# Patient Record
Sex: Female | Born: 1995 | Race: White | Hispanic: No | Marital: Single | Smoking: Never smoker
Health system: Southern US, Community
[De-identification: ages and names within clinical notes are randomized; demographics above are authoritative.]

---
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
## 2015-05-20 ENCOUNTER — Ambulatory Visit (INDEPENDENT_AMBULATORY_CARE_PROVIDER_SITE_OTHER): Payer: Medicaid Other | Admitting: Family Medicine

## 2015-05-20 ENCOUNTER — Encounter: Payer: Self-pay | Admitting: Family Medicine

## 2015-05-20 VITALS — BP 117/55 | HR 80 | Temp 98.6°F | Ht 64.0 in | Wt 200.0 lb

## 2015-05-20 DIAGNOSIS — J029 Acute pharyngitis, unspecified: Secondary | ICD-10-CM

## 2015-05-20 LAB — POCT RAPID STREP A (OFFICE): RAPID STREP A SCREEN: NEGATIVE

## 2015-05-20 MED ORDER — CETIRIZINE HCL 10 MG PO TABS: 10.0000 mg | ORAL_TABLET | Freq: Every day | ORAL | Status: DC

## 2015-05-20 NOTE — Progress Notes (Signed)
    Subjective   Zoe Orr is a 19 y.o. female that presents for a same day visit  SORE THROAT  Sore throat last week. Pain is: burning, tightness, soreness  Severity: 8/10 Medications tried: no Strep throat exposure: yes  STD exposure: no  Symptoms Fever: no Cough: yes Runny nose: yes Muscle aches: yes  Swollen Glands: yes Trouble breathing: yes Drooling: no Weight loss: no  PMH - Smoking status noted.    History  Substance Use Topics  . Smoking status: Never Smoker   . Smokeless tobacco: Not on file  . Alcohol Use: No    ROS Per HPI  Objective   BP 117/55 mmHg  Pulse 80  Temp(Src) 98.6 F (37 C)  Ht 5\' 4"  (1.626 m)  Wt 200 lb (90.719 kg)  BMI 34.31 kg/m2  LMP 04/19/2015 (Approximate)  General: NAD, alert, cooperative with exam, HEENT: NCAT, PERRL, clear conjunctiva, oropharynx clear, supple neck, TM's clear and intact, no tonsillar exudates, boggy turbinates Respiratory/Chest: CTABL, no wheezes, non-labored   Assessment and Plan   Please refer to problem based charting of assessment and plan

## 2015-05-20 NOTE — Patient Instructions (Signed)
Thank you for coming in,   I have sent in the zyrtec. If this persist then please come back and be evaluated again.   Please bring all of your medications with you to each visit.    Please feel free to call with any questions or concerns at any time, at [phone]. --Dr. Jordan Likes

## 2015-05-22 ENCOUNTER — Encounter: Payer: Self-pay | Admitting: Family Medicine

## 2015-05-22 DIAGNOSIS — J029 Acute pharyngitis, unspecified: Secondary | ICD-10-CM | POA: Insufficient documentation

## 2015-05-22 NOTE — Progress Notes (Signed)
One of the assigned preceptors. 

## 2015-05-22 NOTE — Assessment & Plan Note (Signed)
Rapid strep neg. Most likely post nasal drip from URI.  - refilled zyrtec

## 2015-07-22 ENCOUNTER — Ambulatory Visit (INDEPENDENT_AMBULATORY_CARE_PROVIDER_SITE_OTHER): Payer: Medicaid Other | Admitting: Family Medicine

## 2015-07-22 ENCOUNTER — Encounter: Payer: Self-pay | Admitting: Family Medicine

## 2015-07-22 VITALS — BP 120/57 | HR 84 | Temp 98.2°F | Ht 64.0 in | Wt 210.3 lb

## 2015-07-22 DIAGNOSIS — M79671 Pain in right foot: Secondary | ICD-10-CM | POA: Diagnosis not present

## 2015-07-22 DIAGNOSIS — E669 Obesity, unspecified: Secondary | ICD-10-CM

## 2015-07-22 NOTE — Patient Instructions (Signed)
Quit drinking sodas and juice. Your weight is getting out of control. Start slow on the dancing.  Too much too fast will cause injuries. Do build up to much exercise, just slowly.  Staying active is a big key to keeping your weight down.

## 2015-07-22 NOTE — Assessment & Plan Note (Signed)
Clearly musculoskeletal.  Will not x ray now even though stress fracture is possible.  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

## 2015-07-22 NOTE — Progress Notes (Signed)
   Subjective:    Patient ID: Zoe Orr, female    DOB: Sep 02, 1996, 19 y.o.   MRN: [id number]  HPI Not a well child check.  Check right foot pain.  She experience right foot pain right after walking several miles in flip flops.  This occurred 3 days ago and it is still quite sore. She has concerns about how it will affect her work (stands as a Conservation officer, nature at [employer]) and dance classes which are set to resume next week.  Obesity is the other issue we dealt with.  She has gained 40 lbs in the last year.  Admits to drinking lots of sodas.  She says she is active..  Mother states she has way too much screen time - mostly computer and internet.    Review of Systems     Objective:   Physical Exam Right foot.  Pain is at the base of the fifth metatarsal at the area of the tarsal/metatarsal joint.  No swelling, redness or suspicion of foreign body.       Assessment & Plan:

## 2015-07-22 NOTE — Assessment & Plan Note (Signed)
Worrisome especially considering her family history of severe obesity.  Also contributing to foot problems.

## 2015-08-26 ENCOUNTER — Emergency Department (INDEPENDENT_AMBULATORY_CARE_PROVIDER_SITE_OTHER)
Admission: EM | Admit: 2015-08-26 | Discharge: 2015-08-26 | Disposition: A | Discharge status: Home / Self Care | Payer: Medicaid Other | Source: Home / Self Care

## 2015-08-26 ENCOUNTER — Encounter (HOSPITAL_COMMUNITY): Payer: Self-pay | Admitting: Emergency Medicine

## 2015-08-26 DIAGNOSIS — S29012A Strain of muscle and tendon of back wall of thorax, initial encounter: Secondary | ICD-10-CM

## 2015-08-26 MED ORDER — IBUPROFEN 600 MG PO TABS: 600.0000 mg | ORAL_TABLET | Freq: Four times a day (QID) | ORAL | Status: DC | PRN

## 2015-08-26 MED ORDER — METHOCARBAMOL 500 MG PO TABS: 500.0000 mg | ORAL_TABLET | Freq: Four times a day (QID) | ORAL | Status: DC | PRN

## 2015-08-26 NOTE — Discharge Instructions (Signed)
Likely strained some of the muscles of the upper back. Fortunately there is no evidence of permanent injury. This will likely improve over the next several days. Patient never to stay active but avoid any activities that make your symptoms significantly worse. He may feel some pain with activity and that is okay. Please take the medications as prescribed and apply heat or ice to the affected area to see if it helps. Please follow-up with your primary care physician Dr. Leveda Anna.

## 2015-08-26 NOTE — ED Notes (Signed)
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 Steady gait... No acute distress.

## 2015-08-26 NOTE — ED Provider Notes (Signed)
CSN: [id number]     Arrival date & time 08/26/15  1638 History   None    Chief Complaint  Patient presents with  . Back Pain   (Consider location/radiation/quality/duration/timing/severity/associated sxs/prior Treatment) HPI  Upper back pain. Nonradiating. Between the shoulder blades. Gradual onset over the course of the work day and at dance in the evening. Was a fairly constant but with waxing and waning nature. Patient is not taken anything for her symptoms. Denies any chest pain, shortness of breath, palpitations, fevers, rash. Denies any previous trauma to this area. Patient states that she lifts approximately 6-12 packs of drinks at a time at work and then the several lifts and other strenuous activity while at dance.  History reviewed. No pertinent past medical history. History reviewed. No pertinent past surgical history. No family history on file. Social History  Substance Use Topics  . Smoking status: Never Smoker   . Smokeless tobacco: None  . Alcohol Use: No   OB History    No data available     Review of Systems Per HPI with all other pertinent systems negative.   Allergies  Amoxicillin and Ampicillin  Home Medications   Prior to Admission medications   Medication Sig Start Date End Date Taking? Authorizing Provider  amantadine (SYMMETREL) 100 MG capsule Take 200 mg by mouth 2 (two) times daily.     Historical Provider, MD  buPROPion (WELLBUTRIN XL) 300 MG 24 hr tablet Take 300 mg by mouth daily.    Historical Provider, MD  cetirizine (ZYRTEC) 10 MG tablet Take 1 tablet (10 mg total) by mouth daily. 05/20/15   Myra Rude, MD  doxycycline (VIBRA-TABS) 100 MG tablet Take 1 tablet (100 mg total) by mouth 2 (two) times daily. 09/13/14   Moses Manners, MD  ibuprofen (ADVIL,MOTRIN) 600 MG tablet Take 1 tablet (600 mg total) by mouth every 6 (six) hours as needed. 08/26/15   Ozella Rocks, MD  lamoTRIgine (LAMICTAL) 100 MG tablet Take 100 mg by mouth daily.      Historical Provider, MD  methocarbamol (ROBAXIN) 500 MG tablet Take 1 tablet (500 mg total) by mouth every 6 (six) hours as needed for muscle spasms. 08/26/15   Ozella Rocks, MD   Meds Ordered and Administered this Visit  Medications - No data to display  BP 121/73 mmHg  Pulse 64  Temp(Src) 98.6 F (37 C) (Oral)  Resp 16  SpO2 100% No data found.   Physical Exam Physical Exam  Constitutional: oriented to person, place, and time. appears well-developed and well-nourished. No distress.  HENT:  Head: Normocephalic and atraumatic.  Eyes: EOMI. PERRL.  Neck: Normal range of motion.  Cardiovascular: RRR, no m/r/g, 2+ distal pulses,  Pulmonary/Chest: Effort normal and breath sounds normal. No respiratory distress.  Abdominal: Soft. Bowel sounds are normal. NonTTP, no distension.  Musculoskeletal: Back full range of motion, no scapular tenderness, mild mid to upper thoracic paraspinal muscle tenderness to palpation, no focal tenderness or abnormality, no bony upper [place], spine straight. Neurological: alert and oriented to person, place, and time.  Skin: Skin is warm. No rash noted. non diaphoretic.  Psychiatric: normal mood and affect. behavior is normal. Judgment and thought content normal.   ED Course  Procedures (including critical care time)  Labs Review Labs Reviewed - No data to display  Imaging Review No results found.   Visual Acuity Review  Right Eye Distance:   Left Eye Distance:   Bilateral Distance:  Right Eye Near:   Left Eye Near:    Bilateral Near:         MDM   1. Upper back strain, initial encounter    Ibuprofen 600 mg and Robaxin. Patient encouraged to stay active and apply heat or ice as needed.    Ozella Rocks, MD 08/26/15 [phone]

## 2015-09-14 ENCOUNTER — Ambulatory Visit (INDEPENDENT_AMBULATORY_CARE_PROVIDER_SITE_OTHER): Payer: Medicaid Other | Admitting: *Deleted

## 2015-09-14 DIAGNOSIS — Z23 Encounter for immunization: Secondary | ICD-10-CM | POA: Diagnosis present

## 2015-11-15 ENCOUNTER — Encounter: Payer: Self-pay | Admitting: Family Medicine

## 2015-11-15 ENCOUNTER — Ambulatory Visit (INDEPENDENT_AMBULATORY_CARE_PROVIDER_SITE_OTHER): Payer: Medicaid Other | Admitting: Family Medicine

## 2015-11-15 ENCOUNTER — Other Ambulatory Visit (HOSPITAL_COMMUNITY)
Admission: RE | Admit: 2015-11-15 | Discharge: 2015-11-15 | Disposition: A | Discharge status: Home / Self Care | Payer: Medicaid Other | Source: Ambulatory Visit | Attending: Family Medicine | Admitting: Family Medicine

## 2015-11-15 VITALS — BP 122/68 | HR 84 | Temp 98.2°F | Ht 64.0 in | Wt 216.0 lb

## 2015-11-15 DIAGNOSIS — Z202 Contact with and (suspected) exposure to infections with a predominantly sexual mode of transmission: Secondary | ICD-10-CM | POA: Diagnosis present

## 2015-11-15 DIAGNOSIS — R300 Dysuria: Secondary | ICD-10-CM | POA: Diagnosis not present

## 2015-11-15 DIAGNOSIS — Z113 Encounter for screening for infections with a predominantly sexual mode of transmission: Secondary | ICD-10-CM | POA: Insufficient documentation

## 2015-11-15 LAB — POCT URINALYSIS DIPSTICK
Bilirubin, UA: NEGATIVE
Blood, UA: NEGATIVE
GLUCOSE UA: NEGATIVE
Ketones, UA: NEGATIVE
LEUKOCYTES UA: NEGATIVE
NITRITE UA: NEGATIVE
PROTEIN UA: NEGATIVE
Spec Grav, UA: 1.02
Urobilinogen, UA: 0.2
pH, UA: 7.5

## 2015-11-15 LAB — POCT WET PREP (WET MOUNT): CLUE CELLS WET PREP WHIFF POC: NEGATIVE
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
## 2015-11-15 NOTE — Patient Instructions (Signed)
Thank you for coming in today!  We are checking some labs today, and we'll call you with the results and leave a voicemail stating all tests are negative if they are negative. If they are positive we will continue to try to get ahold of you.   Make a follow up appointment with your primary care provider to discuss getting back on your medications and for routine health screenings.   Our clinic's number is [phone]. Feel free to call any time with questions or concerns. We will answer any questions after hours with our 24-hour emergency line at that number as well.   - Dr. Jarvis NewcomerGrunz

## 2015-11-16 LAB — CERVICOVAGINAL ANCILLARY ONLY
Chlamydia: NEGATIVE
Neisseria Gonorrhea: NEGATIVE

## 2015-11-16 LAB — HIV ANTIBODY (ROUTINE TESTING W REFLEX): HIV: NONREACTIVE

## 2015-11-16 LAB — RPR

## 2016-03-02 ENCOUNTER — Other Ambulatory Visit: Payer: Medicaid Other

## 2016-03-02 DIAGNOSIS — Z1159 Encounter for screening for other viral diseases: Secondary | ICD-10-CM

## 2016-03-05 LAB — RUBELLA SCREEN: Rubella: 1.07 Index — ABNORMAL HIGH (ref <0.90)

## 2016-03-06 ENCOUNTER — Encounter: Payer: Self-pay | Admitting: Family Medicine

## 2016-03-08 ENCOUNTER — Telehealth: Payer: Self-pay | Admitting: Family Medicine

## 2016-03-08 NOTE — Telephone Encounter (Signed)
Pt is calling because her teacher would like to get a MMR, because her teacher said that this would be good for her because of the titer readings. Please put the order in for the shot so that she can come and get this. She is needing this by Monday 03/12/16. Zoe Orr

## 2016-03-09 NOTE — Telephone Encounter (Signed)
Agree, no additional immunization needed.

## 2016-03-09 NOTE — Telephone Encounter (Signed)
Spoke with patient and informed her that her titer showed immunity and she has had both MMR required for any program or school.  Advised her I would speak with her PCP to see if there is any need for her to have a 3rd vaccine and that I would call her if this was necessary.  Patient is aware that she is ok to start clinicals based on her titer and shot record.  Zoe Orr,CMA

## 2016-08-07 ENCOUNTER — Encounter (HOSPITAL_COMMUNITY): Payer: Self-pay | Admitting: Emergency Medicine

## 2016-08-07 ENCOUNTER — Emergency Department (HOSPITAL_COMMUNITY)
Admission: EM | Admit: 2016-08-07 | Discharge: 2016-08-07 | Disposition: A | Discharge status: Home / Self Care | Payer: Medicaid Other | Attending: Emergency Medicine | Admitting: Emergency Medicine

## 2016-08-07 DIAGNOSIS — S46812A Strain of other muscles, fascia and tendons at shoulder and upper arm level, left arm, initial encounter: Secondary | ICD-10-CM | POA: Insufficient documentation

## 2016-08-07 DIAGNOSIS — Y9389 Activity, other specified: Secondary | ICD-10-CM | POA: Insufficient documentation

## 2016-08-07 DIAGNOSIS — Y999 Unspecified external cause status: Secondary | ICD-10-CM | POA: Diagnosis not present

## 2016-08-07 DIAGNOSIS — S4992XA Unspecified injury of left shoulder and upper arm, initial encounter: Secondary | ICD-10-CM | POA: Diagnosis present

## 2016-08-07 DIAGNOSIS — Y92410 Unspecified street and highway as the place of occurrence of the external cause: Secondary | ICD-10-CM | POA: Insufficient documentation

## 2016-08-07 DIAGNOSIS — Z79899 Other long term (current) drug therapy: Secondary | ICD-10-CM | POA: Diagnosis not present

## 2016-08-07 DIAGNOSIS — S46819A Strain of other muscles, fascia and tendons at shoulder and upper arm level, unspecified arm, initial encounter: Secondary | ICD-10-CM

## 2016-08-07 MED ORDER — HYDROCODONE-ACETAMINOPHEN 5-325 MG PO TABS: 1.0000 | ORAL_TABLET | ORAL | 0 refills | Status: DC | PRN

## 2016-08-07 MED ORDER — IBUPROFEN 600 MG PO TABS: 600.0000 mg | ORAL_TABLET | Freq: Four times a day (QID) | ORAL | 0 refills | Status: AC

## 2016-08-07 MED ORDER — IBUPROFEN 800 MG PO TABS
800.0000 mg | ORAL_TABLET | Freq: Once | ORAL | Status: AC
  Administered 2016-08-07: 800 mg via ORAL
  Filled 2016-08-07: qty 1

## 2016-08-07 MED ORDER — DIAZEPAM 5 MG PO TABS
5.0000 mg | ORAL_TABLET | Freq: Once | ORAL | Status: AC
  Administered 2016-08-07: 5 mg via ORAL
  Filled 2016-08-07: qty 1

## 2016-08-07 MED ORDER — METHOCARBAMOL 500 MG PO TABS: 500.0000 mg | ORAL_TABLET | Freq: Three times a day (TID) | ORAL | 0 refills | Status: DC

## 2016-08-07 MED ORDER — HYDROCODONE-ACETAMINOPHEN 5-325 MG PO TABS
2.0000 | ORAL_TABLET | Freq: Once | ORAL | Status: AC
  Administered 2016-08-07: 2 via ORAL
  Filled 2016-08-07: qty 2
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
## 2016-08-07 NOTE — Discharge Instructions (Signed)
Your examination suggests trapezius strain on the left greater than the right. Please use 600 mg of ibuprofen with breakfast, lunch, dinner, and at bedtime. Use Robaxin 3 times daily for spasm pain. May use Norco for more severe pain. Robaxin and Norco may cause drowsiness, please use these medications with caution. Do not drive or drink alcohol, or operate machinery when taking these medications. Please see your primary physician, or return to the emergency department if any changes, problems, or concerns.

## 2016-11-29 ENCOUNTER — Ambulatory Visit (INDEPENDENT_AMBULATORY_CARE_PROVIDER_SITE_OTHER): Payer: Medicaid Other | Admitting: Internal Medicine

## 2016-11-29 ENCOUNTER — Encounter: Payer: Self-pay | Admitting: Internal Medicine

## 2016-11-29 VITALS — BP 108/72 | HR 89 | Temp 98.3°F | Wt 228.0 lb

## 2016-11-29 DIAGNOSIS — Z308 Encounter for other contraceptive management: Secondary | ICD-10-CM | POA: Diagnosis not present

## 2016-11-29 DIAGNOSIS — Z23 Encounter for immunization: Secondary | ICD-10-CM | POA: Diagnosis not present

## 2016-11-29 NOTE — Progress Notes (Signed)
[facility] Progress Note  Subjective:  Zoe Orr is a 20 y.o. female who presents for nexplanon removal. This was placed October 15th, 2015, but patient thought it was due to be removed. When informed it lasts 3 years, she still requested removal, as she "never liked it in [her] arm." She also wants to resume normal menstrual cycles. At first, she barely had a period, but she has been having some dark blood in her underwear over the past few months. Initially, she needed to wear a pad, but now has been having spotting only occasionally. She states that she is not sexually active and had the nexplanon inserted to help with painful but not very heavy periods. She still has been having some cramping monthly but has not been taking anything for it. She states that another reason she would like nexplanon removed is because she "might as well get used to" having a period again. She is also concerned that her mood has been worse since having nexplanon inserted. She had similar concerns with depo shots. She is not currently taking any medications for bipolar disorder.  ROS: No increased vaginal discharge, no weakness  Chief Complaint  Patient presents with  . Contraception    Allergies  Allergen Reactions  . Amoxicillin     REACTION: unspecified  . Ampicillin     Objective: Blood pressure 108/72, pulse 89, temperature 98.3 F (36.8 C), temperature source Oral, weight 228 lb (103.4 kg). Body mass index is 39.14 kg/m. Constitutional: Obese female, in NAD Skin: Nexplanon palpable in left upper arm. Small scar where previously inserted.   Psychiatric: Somewhat odd affect.  Vitals reviewed  Assessment/Plan: Despite counseling that breakthrough bleeding is common with Nexplanon and recommendation to try ibuprofen for cramping, patient wished to proceed with Nexplanon removal. This was performed as detailed below. She did not want to start another form of birth control at this  time, as she is not sexually active and wants to know what her "normal cycles" would be like.   Nexplanon Removal Patient was given informed consent for removal of her Nexplanon.  Appropriate time out taken. Nexplanon site identified.  Area prepped in usual sterile fashon. One ml of 1% lidocaine was used to anesthetize the area at the distal end of the implant. A small stab incision was made right beside the implant on the distal portion.  The Nexplanon rod was grasped using hemostats and removed without difficulty.  There was minimal blood loss. There were no complications.  A small amount of antibiotic ointment and steri-strips were applied over the small incision.  A pressure bandage was applied to reduce any bruising.  The patient tolerated the procedure well and was given post procedure instructions.  Patient is planning to use abstinence for contraception.  Follow-up as needed.  Dani GobbleHillary Ariq Khamis, MD [facility], PGY-2

## 2016-11-29 NOTE — Patient Instructions (Signed)
Ms. Zoe Orr,  I removed your nexplanon today. Some bruising at the site is normal. Keep the pressure bandage in place until tomorrow. Keep the strips on for 5 days. If they come off, cover with a band-aid.  If you have trouble with your periods, please come back to see us. I recommend ibuprofen for cramps.  Best, Dr. Sampson GoonFitzgerald

## 2016-11-30 ENCOUNTER — Telehealth: Payer: Self-pay | Admitting: *Deleted

## 2016-11-30 NOTE — Telephone Encounter (Signed)
Please advise patient to take ibuprofen and Tylenol. Unless she is doing a lot of heavy lifting at work, no restrictions medically necessary. Thank you.  Dani GobbleHillary Fitzgerald, MD PGY-2

## 2016-11-30 NOTE — Telephone Encounter (Signed)
Letter placed up front for pick up. Jazmin Hartsell,CMA

## 2016-11-30 NOTE — Telephone Encounter (Signed)
Pt came in office wanting a note because she does heavy lifting at work and she wants a note out of work. Please advise. Deseree Bruna PotterBlount, CMA

## 2016-11-30 NOTE — Telephone Encounter (Signed)
Patient calling again, would like and excuse for today and tomorrow.

## 2016-11-30 NOTE — Telephone Encounter (Signed)
Patient requesting a note to excuse her from work today. States her arm is still really sore from where she had nexplanon removed yesterday. Will forward to MD seen yesterday.

## 2016-11-30 NOTE — Telephone Encounter (Signed)
My, if I did not have the background knowledge that I do with this family, I would want to see Porcia to insure no important surgical complication.  However, knowing how easily overwhelmed Varnell and mom, I doubt this represents an important medical problem.  I will provide the letter as requested.

## 2016-11-30 NOTE — Telephone Encounter (Signed)
Mother called and states that patient is experiencing arm pain, "extreme swelling" and can't lift or move it.  She would like a note for work excusing her for the weekend.  Informed mom that we have already tried reaching out to provider who performed the nexplanon removal and that we will need to wait and hear back from her.  Mother states that she will be here between 11-12:30pm to pick this up.  I advised mom that I can't guarantee that this will be ready by then or that anyone else will write this on behalf of the Dr. Sampson GoonFitzgerald.  Mother was upset and stated that she "will go to the top of [place]", if her daughter loses her job over this letter.  I was unable to offer patient an appointment to be seen today due to mother getting off the phone.  Will forward to MD and also PCP. Veda Arrellano,CMA

## 2017-03-07 ENCOUNTER — Ambulatory Visit (INDEPENDENT_AMBULATORY_CARE_PROVIDER_SITE_OTHER): Payer: Medicaid Other | Admitting: Family Medicine

## 2017-03-07 ENCOUNTER — Encounter: Payer: Self-pay | Admitting: Family Medicine

## 2017-03-07 DIAGNOSIS — R300 Dysuria: Secondary | ICD-10-CM

## 2017-03-07 DIAGNOSIS — N912 Amenorrhea, unspecified: Secondary | ICD-10-CM

## 2017-03-07 DIAGNOSIS — E669 Obesity, unspecified: Secondary | ICD-10-CM | POA: Diagnosis not present

## 2017-03-07 DIAGNOSIS — E282 Polycystic ovarian syndrome: Secondary | ICD-10-CM | POA: Insufficient documentation

## 2017-03-07 LAB — POCT URINE PREGNANCY: PREG TEST UR: NEGATIVE

## 2017-03-07 LAB — POCT URINALYSIS DIP (MANUAL ENTRY)
Bilirubin, UA: NEGATIVE
Blood, UA: NEGATIVE
Glucose, UA: NEGATIVE
Ketones, POC UA: NEGATIVE
LEUKOCYTES UA: NEGATIVE
NITRITE UA: NEGATIVE
PROTEIN UA: NEGATIVE
UROBILINOGEN UA: 0.2 (ref ?–2.0)
pH, UA: 6 (ref 5.0–8.0)

## 2017-03-07 MED ORDER — MEDROXYPROGESTERONE ACETATE 10 MG PO TABS: 10.0000 mg | ORAL_TABLET | Freq: Every day | ORAL | 0 refills | Status: DC

## 2017-03-07 NOTE — Patient Instructions (Addendum)
I doubt there is a problem causing your lack of periods.  It just takes time. See me in June/July if still not having regular periods. I will call with lab test results You do not have a bladder infection.

## 2017-03-07 NOTE — Assessment & Plan Note (Signed)
Likely related to nexplanon.  Could have underlying PCOS.  For now, cycle with progesterone and wait.  Check TSH.  More work up in 3 months if still amenorrhea.

## 2017-03-07 NOTE — Progress Notes (Signed)
   Subjective:    Patient ID: Zoe Orr, female    DOB: 10/15/1996, 21 y.o.   MRN: [id number]  HPI Amenorrhea since nexplanon removed 11/29/16.  Was on menses when nexplanon removed.  Denies sexual activity.  States no possibility of pregnancy.  States periods were regular prior to nexplanon insertion in Oct 2015.  Denies pain, vag DC fever.  No spotting.    Also C/O 2 days of mild dysuria.  Wonders if she has UTI.    Review of Systems     Objective:   Physical Exam Obese.  Not hirsuit. Abd benign U preg neg UA, not worrisome for infection.        Assessment & Plan:

## 2017-03-07 NOTE — Assessment & Plan Note (Signed)
Doubt PCOS at this point.

## 2017-03-07 NOTE — Assessment & Plan Note (Signed)
No UTI by UA

## 2017-03-08 LAB — TSH: TSH: 1.05 u[IU]/mL (ref 0.450–4.500)

## 2017-03-08 LAB — CBC
HEMATOCRIT: 44.1 % (ref 34.0–46.6)
HEMOGLOBIN: 15 g/dL (ref 11.1–15.9)
MCH: 30.3 pg (ref 26.6–33.0)
MCHC: 34 g/dL (ref 31.5–35.7)
MCV: 89 fL (ref 79–97)
Platelets: 285 10*3/uL (ref 150–379)
RBC: 4.95 x10E6/uL (ref 3.77–5.28)
RDW: 14.6 % (ref 12.3–15.4)
WBC: 6.9 10*3/uL (ref 3.4–10.8)

## 2017-06-27 ENCOUNTER — Ambulatory Visit (INDEPENDENT_AMBULATORY_CARE_PROVIDER_SITE_OTHER): Payer: Medicaid Other | Admitting: Family Medicine

## 2017-06-27 ENCOUNTER — Encounter: Payer: Self-pay | Admitting: Family Medicine

## 2017-06-27 DIAGNOSIS — E669 Obesity, unspecified: Secondary | ICD-10-CM

## 2017-06-27 DIAGNOSIS — N912 Amenorrhea, unspecified: Secondary | ICD-10-CM

## 2017-06-27 MED ORDER — MEDROXYPROGESTERONE ACETATE 10 MG PO TABS: 10.0000 mg | ORAL_TABLET | Freq: Every day | ORAL | 0 refills | Status: DC

## 2017-06-27 NOTE — Assessment & Plan Note (Signed)
Likely anovulatory given previous response to provera.  Will do amenorrhea/PCOS labs.  Cycle again with prover.

## 2017-06-27 NOTE — Patient Instructions (Signed)
I sent in another prescription for the medicine to bring on your period.  You need to have a period every three months. I will call with the blood test results. The key for you is weight loss.

## 2017-06-27 NOTE — Progress Notes (Signed)
   Subjective:    Patient ID: Zoe Orr, female    DOB: 10/02/1996, 21 y.o.   MRN: [id number]  HPI patient with ammenorrhea since removal of nexplanon 11/2016  It had been insert on 09/2014.  Menarache age 21.  Was regular with menses until nexplanon.  Coincidentally, she gained 50 lbs from 2015 to 2018.  Did have withdrawal menses with provera.  She denies abd pain.  Not sexually active.  No chance of prenancy.  She is hirsuit.  Given age and wt, PCOS is also a consideration.    Review of Systems     Objective:   Physical Exam Abd benign.       Assessment & Plan:

## 2017-06-27 NOTE — Assessment & Plan Note (Signed)
Contributing to medical problems.  Strongly encouraged wt loss and exercise.

## 2017-06-28 LAB — PROLACTIN: PROLACTIN: 23 ng/mL (ref 4.8–23.3)

## 2017-06-28 LAB — FSH/LH
FSH: 4.6 m[IU]/mL
LH: 28.4 m[IU]/mL

## 2017-06-28 LAB — TESTOSTERONE: TESTOSTERONE: 59 ng/dL — AB (ref 8–48)

## 2017-07-01 ENCOUNTER — Telehealth: Payer: Self-pay | Admitting: Family Medicine

## 2017-07-01 NOTE — Telephone Encounter (Signed)
Called and spoke to patient.  Told blood test suggests PCOS.  Has appointment to see me in two weeks.  Will explain more and likely start OCPs at that time.

## 2017-07-01 NOTE — Telephone Encounter (Signed)
Mom would like to be called regarding pt's lab results. ep

## 2017-07-17 ENCOUNTER — Ambulatory Visit (INDEPENDENT_AMBULATORY_CARE_PROVIDER_SITE_OTHER): Payer: Medicaid Other | Admitting: Family Medicine

## 2017-07-17 ENCOUNTER — Encounter: Payer: Self-pay | Admitting: Family Medicine

## 2017-07-17 VITALS — BP 110/72 | HR 71 | Temp 98.3°F | Ht 64.0 in | Wt 243.4 lb

## 2017-07-17 DIAGNOSIS — E282 Polycystic ovarian syndrome: Secondary | ICD-10-CM | POA: Diagnosis not present

## 2017-07-17 DIAGNOSIS — Z01419 Encounter for gynecological examination (general) (routine) without abnormal findings: Secondary | ICD-10-CM

## 2017-07-17 DIAGNOSIS — Z124 Encounter for screening for malignant neoplasm of cervix: Secondary | ICD-10-CM | POA: Diagnosis not present

## 2017-07-17 NOTE — Patient Instructions (Signed)
You have polycystic ovary disease.  Your body does not produce the exact correct female hormones.  It is best treated with

## 2017-07-18 ENCOUNTER — Telehealth: Payer: Self-pay | Admitting: Family Medicine

## 2017-07-18 ENCOUNTER — Other Ambulatory Visit (HOSPITAL_COMMUNITY)
Admission: RE | Admit: 2017-07-18 | Discharge: 2017-07-18 | Disposition: A | Discharge status: Home / Self Care | Payer: Medicaid Other | Source: Ambulatory Visit | Attending: Family Medicine | Admitting: Family Medicine

## 2017-07-18 DIAGNOSIS — Z124 Encounter for screening for malignant neoplasm of cervix: Secondary | ICD-10-CM | POA: Diagnosis not present

## 2017-07-18 DIAGNOSIS — Z01419 Encounter for gynecological examination (general) (routine) without abnormal findings: Secondary | ICD-10-CM | POA: Insufficient documentation

## 2017-07-18 MED ORDER — NORETHIN-ETH ESTRAD TRIPHASIC 0.5/0.75/1-35 MG-MCG PO TABS: 1.0000 | ORAL_TABLET | Freq: Every day | ORAL | 11 refills | Status: DC

## 2017-07-18 NOTE — Assessment & Plan Note (Signed)
Start BCP in this non smoker.

## 2017-07-18 NOTE — Telephone Encounter (Signed)
Done

## 2017-07-18 NOTE — Telephone Encounter (Signed)
Mother is calling to remind the doctor not to forget and call in her daughter Zoe Orr pills. She knows that [employer] was down and we didn't have access to escribe medications. Please call in today. jw

## 2017-07-18 NOTE — Progress Notes (Signed)
   Subjective:    Patient ID: Zoe SilverAriel M Orr, female    DOB: 05/04/1996, 21 y.o.   MRN: [id number]  HPI FU ammenorrhea.  Did have withdrawal period after provera.  Labs show elevated testosterone.  Given complex of ammenorrhea, obesity and hirsutism, warrants dx of polycystic ovary.  Non smoker.  Discussed Rx with BCPs  21st birthday today, which makes her due for pap      Review of Systems     Objective:   Physical ExamAbd benign Pelvic scant old blood in vaginal vault.  Cervix normal Pap taken.  Bimanual normal but limited due to obesity.          Assessment & Plan:

## 2017-07-18 NOTE — Assessment & Plan Note (Signed)
First pap/pelvic done today.

## 2017-07-22 LAB — CYTOLOGY - PAP
CHLAMYDIA, DNA PROBE: NEGATIVE
Diagnosis: NEGATIVE
NEISSERIA GONORRHEA: NEGATIVE

## 2017-09-02 ENCOUNTER — Ambulatory Visit: Payer: Self-pay | Admitting: Student

## 2017-09-16 ENCOUNTER — Telehealth: Payer: Self-pay | Admitting: *Deleted

## 2017-09-16 NOTE — Telephone Encounter (Signed)
Patient's mom called stating patient should have started her menstrual  cycle last Monday.  She only had small amount of dark discharge, not enough to wear a pad.  Patient is taking birth control pills as prescribed. Advised mom to have patient schedule an appointment to discuss birth control and menstrual cycle. Clovis Pu, RN

## 2017-09-16 NOTE — Progress Notes (Signed)
   Subjective:   Patient ID: Zoe Orr    DOB: Aug 17, 1996, 21 y.o. female   MRN: [id number]  Zoe Orr is a 20 y.o. female with a history of PCOS, bipolar disorder here for   Menstrual irregularities - LMP 08/09/2017 - currently on combo OCPs with good compliance, has alarm set on phone - not sexually active - Negative Pap Smear 07/2017 - Testosterone level 59 06/2017 - Wants more regular cycles - spotted about a week before she was supposed to have a period but then did not have a period when she was supposed to this month. Last month bled for two weeks. - Currently is in the 1st week of her 3rd month on OCPs - States flow is average. Does endorse some stomach cramps and heartburn, some nausea and vomited 3 times in the morning once last week but none since then.  Review of Systems:  Per HPI.   PMFSH: reviewed. Smoking status reviewed. Medications reviewed.  Objective:   BP 100/80   Pulse 84   Temp 98.8 F (37.1 C) (Oral)   Wt 240 lb (108.9 kg)   LMP 08/09/2017   SpO2 98%   BMI 41.20 kg/m  Vitals and nursing note reviewed.  General: obese female in no acute distress with non-toxic appearance HEENT: normocephalic, atraumatic, moist mucous membranes CV: regular rate and rhythm without murmurs, rubs, or gallops Lungs: clear to auscultation bilaterally with normal work of breathing Skin: warm, dry, no rashes or lesions Extremities: warm and well perfused, normal tone MSK: Full ROM, strength intact, gait normal Neuro: Alert and oriented, speech normal  Assessment & Plan:   Polycystic ovarian disease Currently on OCPs for the past 3 months with good compliance but still having menstrual irregularities. Patient desires more regular cycles and wants to stay on OCPs. Testosterone 59 06/2017. Counseled on weight loss through diet and exercise. Suspect her cycles will become more regular as she loses weight. Handout given. - continue OCPs - Metformin  daily -  Follow up in one month, will likely increase Metformin dose at that time to achieve maximum benefit of metabolic effects.  No orders of the defined types were placed in this encounter.  Meds ordered this encounter  Medications  . buPROPion (WELLBUTRIN XL) 150 MG 24 hr tablet    Sig: Take 3 a day  . metFORMIN (GLUCOPHAGE) 500 MG tablet    Sig: Take 1 tablet (500 mg total) by mouth daily with breakfast.    Dispense:  30 tablet    Refill:  0    Ellwood Dense, DO PGY-1, [facility] 09/17/2017 6:27 PM

## 2017-09-17 ENCOUNTER — Telehealth: Payer: Self-pay | Admitting: *Deleted

## 2017-09-17 ENCOUNTER — Encounter: Payer: Self-pay | Admitting: Family Medicine

## 2017-09-17 ENCOUNTER — Ambulatory Visit (INDEPENDENT_AMBULATORY_CARE_PROVIDER_SITE_OTHER): Payer: Medicaid Other | Admitting: Family Medicine

## 2017-09-17 VITALS — BP 100/80 | HR 84 | Temp 98.8°F | Wt 240.0 lb

## 2017-09-17 DIAGNOSIS — N926 Irregular menstruation, unspecified: Secondary | ICD-10-CM

## 2017-09-17 DIAGNOSIS — E282 Polycystic ovarian syndrome: Secondary | ICD-10-CM

## 2017-09-17 DIAGNOSIS — Z975 Presence of (intrauterine) contraceptive device: Secondary | ICD-10-CM

## 2017-09-17 MED ORDER — METFORMIN HCL 500 MG PO TABS: 500.0000 mg | ORAL_TABLET | Freq: Every day | ORAL | 0 refills | Status: DC

## 2017-09-17 NOTE — Telephone Encounter (Signed)
Patient called stating she has Family [employer] Medicaid and can't afford the metformin. It is $15, can something else be sent in. Please give her a call or her mom.  Clovis Pu, RN

## 2017-09-17 NOTE — Patient Instructions (Signed)
It was great to see you!  For your irregular periods,  - Continue taking your birth control pills at the same time every day. - Take Metformin once daily with food.  - Work on weight loss as this has proven to improve regularity with menstrual cycles. - Follow up in one month. At that time, we may be increasing your Metformin dose.  Take care and seek immediate care sooner if you develop any concerns.   Ellwood Dense, DO [facility]   Diet for Polycystic Ovarian Syndrome Polycystic ovary syndrome (PCOS) is a disorder of the chemical messengers (hormones) that regulate menstruation. The condition causes important hormones to be out of balance. PCOS can:  Make your periods irregular or stop.  Cause cysts to develop on the ovaries.  Make it difficult to get pregnant.  Stop your body from responding to the effects of insulin (insulin resistance), which can lead to obesity and diabetes.  Changing what you eat can help manage PCOS and improve your health. It can help you lose weight and improve the way your body uses insulin. What is my plan?  Eat breakfast, lunch, and dinner plus two snacks every day.  Include protein in each meal and snack.  Choose whole grains instead of products made with refined flour.  Eat a variety of foods.  Exercise regularly as told by your health care provider. What do I need to know about this eating plan? If you are overweight or obese, pay attention to how many calories you eat. Cutting down on calories can help you lose weight. Work with your health care provider or dietitian to figure out how many calories you need each day. What foods can I eat? Grains Whole grains, such as whole wheat. Whole-grain breads, crackers, cereals, and pasta. Unsweetened oatmeal, bulgur, barley, quinoa, or brown rice. Corn or whole-wheat flour tortillas. Vegetables  Lettuce. Spinach. Peas. Beets. Cauliflower. Cabbage. Broccoli. Carrots. Tomatoes. Squash.  Eggplant. Herbs. Peppers. Onions. Cucumbers. Brussels sprouts. Fruits Berries. Bananas. Apples. Oranges. Grapes. Papaya. Mango. Pomegranate. Kiwi. Grapefruit. Cherries. Meats and Other Protein Sources Lean proteins, such as fish, chicken, beans, eggs, and tofu. Dairy Low-fat dairy products, such as skim milk, cheese sticks, and yogurt. Beverages Low-fat or fat-free drinks, such as water, low-fat milk, sugar-free drinks, and 100% fruit juice. Condiments Ketchup. Mustard. Barbecue sauce. Relish. Low-fat or fat-free mayonnaise. Fats and Oils Olive oil or canola oil. Walnuts and almonds. The items listed above may not be a complete list of recommended foods or beverages. Contact your dietitian for more options. What foods are not recommended? Foods high in calories or fat. Fried foods. Sweets. Products made from refined white flour, including white bread, pastries, white rice, and pasta. The items listed above may not be a complete list of foods and beverages to avoid. Contact your dietitian for more information. This information is not intended to replace advice given to you by your health care provider. Make sure you discuss any questions you have with your health care provider. Document Released: 03/19/2016 Document Revised: 05/03/2016 Document Reviewed: 12/08/2014 Elsevier Interactive Patient Education  [employer].  Here is an example of what a healthy plate looks like:    ? Make half your plate fruits and vegetables.     ? Focus on whole fruits.     ? Vary your veggies.  ? Make half your grains whole grains. -     ? Look for the word "whole" at the beginning of the ingredients list    ?  Some whole-grain ingredients include whole oats, whole-wheat flour,        whole-grain corn, whole-grain brown rice, and whole rye.  ? Move to low-fat and fat-free milk or yogurt.  ? Vary your protein routine. - Meat, fish, poultry (chicken, [place]), eggs, beans (kidney, pinto),  dairy.  ? Drink and eat less sodium, saturated fat, and added sugars.

## 2017-09-17 NOTE — Assessment & Plan Note (Addendum)
Currently on OCPs for the past 3 months with good compliance but still having menstrual irregularities. Patient desires more regular cycles and wants to stay on OCPs. Testosterone 59 06/2017. Counseled on weight loss through diet and exercise. Suspect her cycles will become more regular as she loses weight. Handout given. - continue OCPs - Metformin  daily - Follow up in one month, will likely increase Metformin dose at that time to achieve maximum benefit of metabolic effects.

## 2017-09-18 ENCOUNTER — Other Ambulatory Visit: Payer: Self-pay | Admitting: Family Medicine

## 2017-09-18 DIAGNOSIS — N926 Irregular menstruation, unspecified: Secondary | ICD-10-CM

## 2017-09-18 NOTE — Telephone Encounter (Signed)
Refilled - which may help with the message about not being able to afford.  Made sure she was aware that this is a three month supply and unlikely to be much cheaper.  LM to call back if further questions.

## 2017-09-18 NOTE — Telephone Encounter (Signed)
Refilled per request at [facility]- which may help with the message about not being able to afford.  Made sure she was aware that this is a three month supply and unlikely to be much cheaper.  LM to call back if further questions.

## 2017-11-25 ENCOUNTER — Telehealth: Payer: Self-pay | Admitting: Family Medicine

## 2018-05-18 ENCOUNTER — Other Ambulatory Visit: Payer: Self-pay | Admitting: Family Medicine

## 2018-05-18 DIAGNOSIS — N926 Irregular menstruation, unspecified: Secondary | ICD-10-CM

## 2018-05-19 NOTE — Telephone Encounter (Signed)
Called and discussed.  I told her I would refill but she needs a visit.

## 2018-07-24 ENCOUNTER — Ambulatory Visit: Payer: Medicaid Other | Admitting: Family Medicine

## 2018-08-07 ENCOUNTER — Other Ambulatory Visit: Payer: Self-pay

## 2018-08-07 ENCOUNTER — Ambulatory Visit: Payer: Self-pay | Admitting: Family Medicine

## 2018-08-07 ENCOUNTER — Encounter: Payer: Self-pay | Admitting: Family Medicine

## 2018-08-07 DIAGNOSIS — N926 Irregular menstruation, unspecified: Secondary | ICD-10-CM

## 2018-08-07 DIAGNOSIS — E282 Polycystic ovarian syndrome: Secondary | ICD-10-CM

## 2018-08-07 DIAGNOSIS — E669 Obesity, unspecified: Secondary | ICD-10-CM

## 2018-08-07 DIAGNOSIS — F3170 Bipolar disorder, currently in remission, most recent episode unspecified: Secondary | ICD-10-CM

## 2018-08-07 MED ORDER — NORETHIN-ETH ESTRAD TRIPHASIC 0.5/0.75/1-35 MG-MCG PO TABS: 1.0000 | ORAL_TABLET | Freq: Every day | ORAL | 11 refills | Status: AC

## 2018-08-07 MED ORDER — METFORMIN HCL 500 MG PO TABS: 500.0000 mg | ORAL_TABLET | Freq: Two times a day (BID) | ORAL | 3 refills | Status: AC

## 2018-08-07 NOTE — Assessment & Plan Note (Signed)
Stable off meds. ?

## 2018-08-07 NOTE — Patient Instructions (Signed)
Please increase your metformin to two times a day.   Please keep working hard on your weight.   You will naturally get more exercise as your get back in school. It sounds like you are on a career path.

## 2018-08-07 NOTE — Assessment & Plan Note (Signed)
Stable on meds.  Still increase metformin to 500 bid.

## 2018-08-07 NOTE — Assessment & Plan Note (Signed)
Strong fhx of obesity.  Encouraged to get weight under control now while young.

## 2018-08-07 NOTE — Progress Notes (Signed)
   Subjective:    Patient ID: Zoe Orr, female    DOB: 08/26/1996, 22 y.o.   MRN: [id number]  HPI FU multiple issues 1. PCOS.  Much improved menstrual regularity and less facial hair on metformin and OCP.  Only on low dose metformin.  Wt is stable with no loss or gain. 2. Obesity.  "I laid on the couch all summer."  Starting back to school.  On a career path for medical coding.  4 more classes at [employer].  She will be more active in school 3. Bipolar, seems to be quiescent. She is off all meds and doing well without depression.  Life can be a bit overwhelming to her but no specific worries at present.     Review of Systems     Objective:   Physical Exam Lungs clear Cardiac RRR without m or g Abd benign       Assessment & Plan:

## 2021-02-23 NOTE — Telephone Encounter (Signed)
Error
